# Patient Record
Sex: Female | Born: 1965 | Race: White | Hispanic: No | Marital: Married | Smoking: Never smoker
Health system: Southern US, Community
[De-identification: ages and names within clinical notes are randomized; demographics above are authoritative.]

---

## 2009-05-06 ENCOUNTER — Emergency Department (HOSPITAL_BASED_OUTPATIENT_CLINIC_OR_DEPARTMENT_OTHER): Admission: EM | Admit: 2009-05-06 | Discharge: 2009-05-06 | Payer: Self-pay | Admitting: Emergency Medicine

## 2010-07-06 LAB — URINALYSIS, ROUTINE W REFLEX MICROSCOPIC
Bilirubin Urine: NEGATIVE
Glucose, UA: NEGATIVE mg/dL
Hgb urine dipstick: NEGATIVE
Ketones, ur: NEGATIVE mg/dL
Nitrite: NEGATIVE
Protein, ur: NEGATIVE mg/dL
Specific Gravity, Urine: 1.019 (ref 1.005–1.030)
Urobilinogen, UA: 0.2 mg/dL (ref 0.0–1.0)
pH: 6.5 (ref 5.0–8.0)

## 2015-06-10 ENCOUNTER — Other Ambulatory Visit: Payer: Self-pay | Admitting: Physician Assistant

## 2015-06-10 DIAGNOSIS — Z1231 Encounter for screening mammogram for malignant neoplasm of breast: Secondary | ICD-10-CM

## 2015-08-15 ENCOUNTER — Encounter (HOSPITAL_BASED_OUTPATIENT_CLINIC_OR_DEPARTMENT_OTHER): Payer: Self-pay | Admitting: *Deleted

## 2015-08-15 ENCOUNTER — Emergency Department (HOSPITAL_BASED_OUTPATIENT_CLINIC_OR_DEPARTMENT_OTHER): Payer: 59

## 2015-08-15 ENCOUNTER — Emergency Department (HOSPITAL_BASED_OUTPATIENT_CLINIC_OR_DEPARTMENT_OTHER)
Admission: EM | Admit: 2015-08-15 | Discharge: 2015-08-15 | Disposition: A | Discharge status: Home / Self Care | Payer: 59 | Attending: Emergency Medicine | Admitting: Emergency Medicine

## 2015-08-15 DIAGNOSIS — Y939 Activity, unspecified: Secondary | ICD-10-CM | POA: Diagnosis not present

## 2015-08-15 DIAGNOSIS — S60212A Contusion of left wrist, initial encounter: Secondary | ICD-10-CM | POA: Diagnosis not present

## 2015-08-15 DIAGNOSIS — Y92009 Unspecified place in unspecified non-institutional (private) residence as the place of occurrence of the external cause: Secondary | ICD-10-CM | POA: Diagnosis not present

## 2015-08-15 DIAGNOSIS — Y999 Unspecified external cause status: Secondary | ICD-10-CM | POA: Insufficient documentation

## 2015-08-15 DIAGNOSIS — Z79899 Other long term (current) drug therapy: Secondary | ICD-10-CM | POA: Diagnosis not present

## 2015-08-15 DIAGNOSIS — W010XXA Fall on same level from slipping, tripping and stumbling without subsequent striking against object, initial encounter: Secondary | ICD-10-CM | POA: Diagnosis not present

## 2015-08-15 DIAGNOSIS — M25532 Pain in left wrist: Secondary | ICD-10-CM | POA: Diagnosis present

## 2015-08-15 NOTE — ED Provider Notes (Signed)
CSN: [id number]     Arrival date & time 08/15/15  1235 History   First MD Initiated Contact with Patient 08/15/15 1502     Chief Complaint  Patient presents with  . Wrist Pain     (Consider location/radiation/quality/duration/timing/severity/associated sxs/prior Treatment) HPI Patient presents with left wrist swelling and pain after fall at 1 PM today. States she was walking out of her house with suitcase in her left hand and tripped and fell onto suitcase. She had immediate pain to the left wrist with swelling. No limited mobility or weakness. She has no numbness or tingling. Denies any head or neck injury. History reviewed. No pertinent past medical history. History reviewed. No pertinent past surgical history. History reviewed. No pertinent family history. Social History  Substance Use Topics  . Smoking status: Never Smoker   . Smokeless tobacco: None  . Alcohol Use: No   OB History    No data available     Review of Systems  Constitutional: Negative for fever and chills.  HENT: Negative for facial swelling.   Respiratory: Negative for shortness of breath.   Cardiovascular: Negative for chest pain.  Gastrointestinal: Negative for nausea, vomiting, abdominal pain and diarrhea.  Musculoskeletal: Positive for arthralgias. Negative for myalgias, neck pain and neck stiffness.  Skin: Positive for wound. Negative for rash.  Neurological: Negative for dizziness, weakness, light-headedness, numbness and headaches.  All other systems reviewed and are negative.     Allergies  Aspirin  Home Medications   Prior to Admission medications   Medication Sig Start Date End Date Taking? Authorizing Provider  traZODone (DESYREL) 100 MG tablet Take 100 mg by mouth at bedtime.   Yes Historical Provider, MD   BP 172/89 mmHg  Pulse 90  Temp(Src) 98.6 F (37 C) (Oral)  Resp 18  Ht  (1.6 m)  Wt 160 lb (72.576 kg)  BMI 28.35 kg/m2  SpO2 100% Physical Exam  Constitutional: She is  oriented to person, place, and time. She appears well-developed and well-nourished. No distress.  HENT:  Head: Normocephalic and atraumatic.  Eyes: EOM are normal. Pupils are equal, round, and reactive to light.  Neck: Normal range of motion. Neck supple.  No posterior midline cervical tenderness to palpation.  Cardiovascular: Normal rate and regular rhythm.  Exam reveals no gallop and no friction rub.   No murmur heard. Pulmonary/Chest: Effort normal and breath sounds normal. No respiratory distress. She has no wheezes. She has no rales. She exhibits no tenderness.  Abdominal: Soft. Bowel sounds are normal. She exhibits no distension and no mass. There is no tenderness. There is no rebound and no guarding.  Musculoskeletal: Normal range of motion. She exhibits tenderness. She exhibits no edema.  No midline thoracic or lumbar tenderness. Pelvis is stable. Patient does have contusion and swelling to the medial surface of the left distal forearm. Chest tenderness over the distal radius. No carpal or hand tenderness. No snuffbox tenderness. Distal pulses are equal and intact. Good distal cap refill. Full range of motion of the left elbow and shoulder. No tenderness, swelling or deformity.  Neurological: She is alert and oriented to person, place, and time.  Normal bilateral grip strength. 5/5 motor in all extremities. Sensation is fully intact.  Skin: Skin is warm and dry. No rash noted. No erythema.  Psychiatric: She has a normal mood and affect. Her behavior is normal.  Nursing note and vitals reviewed.   ED Course  Procedures (including critical care time) Labs Review Labs Reviewed -  No data to display  Imaging Review Dg Wrist Complete Left  08/15/2015  CLINICAL DATA:  Medial left wrist pain secondary to a fall in the garage today. EXAM: LEFT WRIST - COMPLETE 3+ VIEW COMPARISON:  None. FINDINGS: There is no evidence of fracture or dislocation. There is soft tissue edema and swelling along  the lateral aspect of the distal radial shaft. IMPRESSION: No acute bone abnormality.  Soft tissue contusion. Electronically Signed   By: Francene BoyersJames  Maxwell M.D.   On: 08/15/2015 13:17   I have personally reviewed and evaluated these images and lab results as part of my medical decision-making.   EKG Interpretation None      MDM   Final diagnoses:  Wrist contusion, left, initial encounter    X-ray without evidence of fracture. Patient has obvious contusion and may also have sprained the wrist. No other injuries appreciated. Placed in splint. Advised if she continues to have pain that she follow-up with hand specialist. Return precautions given.    Loren Raceravid Layney Gillson, MD 08/15/15 [phone]

## 2015-08-15 NOTE — ED Notes (Signed)
MD at bedside. 

## 2015-08-15 NOTE — ED Notes (Signed)
Left wrist pain with obvious deformity since falling today.  CMS intact.

## 2015-08-15 NOTE — Discharge Instructions (Signed)
Contusion °A contusion is a deep bruise. Contusions are the result of a blunt injury to tissues and muscle fibers under the skin. The injury causes bleeding under the skin. The skin overlying the contusion may turn blue, purple, or yellow. Minor injuries will give you a painless contusion, but more severe contusions may stay painful and swollen for a few weeks.  °CAUSES  °This condition is usually caused by a blow, trauma, or direct force to an area of the body. °SYMPTOMS  °Symptoms of this condition include: °· Swelling of the injured area. °· Pain and tenderness in the injured area. °· Discoloration. The area may have redness and then turn blue, purple, or yellow. °DIAGNOSIS  °This condition is diagnosed based on a physical exam and medical history. An X-ray, CT scan, or MRI may be needed to determine if there are any associated injuries, such as broken bones (fractures). °TREATMENT  °Specific treatment for this condition depends on what area of the body was injured. In general, the best treatment for a contusion is resting, icing, applying pressure to (compression), and elevating the injured area. This is often called the RICE strategy. Over-the-counter anti-inflammatory medicines may also be recommended for pain control.  °HOME CARE INSTRUCTIONS  °· Rest the injured area. °· If directed, apply ice to the injured area: °· Put ice in a plastic bag. °· Place a towel between your skin and the bag. °· Leave the ice on for 20 minutes, 2-3 times per day. °· If directed, apply light compression to the injured area using an elastic bandage. Make sure the bandage is not wrapped too tightly. Remove and reapply the bandage as directed by your health care provider. °· If possible, raise (elevate) the injured area above the level of your heart while you are sitting or lying down. °· Take over-the-counter and prescription medicines only as told by your health care provider. °SEEK MEDICAL CARE IF: °· Your symptoms do not  improve after several days of treatment. °· Your symptoms get worse. °· You have difficulty moving the injured area. °SEEK IMMEDIATE MEDICAL CARE IF:  °· You have severe pain. °· You have numbness in a hand or foot. °· Your hand or foot turns pale or cold. °  °This information is not intended to replace advice given to you by your health care provider. Make sure you discuss any questions you have with your health care provider. °  °Document Released: 01/13/2005 Document Revised: 12/25/2014 Document Reviewed: 08/21/2014 °Elsevier Interactive Patient Education ©2016 [employer] ° °Wrist Sprain °A wrist sprain is a stretch or tear in the strong, fibrous tissues (ligaments) that connect your wrist bones. The ligaments of your wrist may be easily sprained. There are three types of wrist sprains. °· Grade 1. The ligament is not stretched or torn, but the sprain causes pain. °· Grade 2. The ligament is stretched or partially torn. You may be able to move your wrist, but not very much. °· Grade 3. The ligament or muscle completely tears. You may find it difficult or extremely painful to move your wrist even a little. °CAUSES °Often, wrist sprains are a result of a fall or an injury. The force of the impact causes the fibers of your ligament to stretch too much or tear. Common causes of wrist sprains include: °· Overextending your wrist while catching a ball with your hands. °· Repetitive or strenuous extension or bending of your wrist. °· Landing on your hand during a fall. °RISK FACTORS °· Having   wrist injuries.  Playing contact sports, such as boxing or wrestling.  Participating in activities in which falling is common.  Having poor wrist strength and flexibility. SIGNS AND SYMPTOMS  Wrist pain.  Wrist tenderness.  Inflammation or bruising of the wrist area.  Hearing a "pop" or feeling a tear at the time of the injury.  Decreased wrist movement due to pain, stiffness, or  weakness. DIAGNOSIS Your health care provider will examine your wrist. In some cases, an X-ray will be taken to make sure you did not break any bones. If your health care provider thinks that you tore a ligament, he or she may order an MRI of your wrist. TREATMENT Treatment involves resting and icing your wrist. You may also need to take pain medicines to help lessen pain and inflammation. Your health care provider may recommend keeping your wrist still (immobilized) with a splint to help your sprain heal. When the splint is no longer necessary, you may need to perform strengthening and stretching exercises. These exercises help you to regain strength and full range of motion in your wrist. Surgery is not usually needed for wrist sprains unless the ligament completely tears. HOME CARE INSTRUCTIONS  Rest your wrist. Do not do things that cause pain.  Wear your wrist splint as directed by your health care provider.  Take medicines only as directed by your health care provider.  To ease pain and swelling, apply ice to the injured area.  Put ice in a plastic bag.  Place a towel between your skin and the bag.  Leave the ice on for 20 minutes, 2-3 times a day. SEEK MEDICAL CARE IF:  Your pain, discomfort, or swelling gets worse even with treatment.  You feel sudden numbness in your hand.   This information is not intended to replace advice given to you by your health care provider. Make sure you discuss any questions you have with your health care provider.   Document Released: 12/07/2013 Document Reviewed: 12/07/2013 Elsevier Interactive Patient Education [employer]2016 [employer]

## 2015-09-04 ENCOUNTER — Ambulatory Visit
Admission: RE | Admit: 2015-09-04 | Discharge: 2015-09-04 | Disposition: A | Discharge status: Home / Self Care | Payer: 59 | Source: Ambulatory Visit | Attending: Physician Assistant | Admitting: Physician Assistant

## 2015-09-04 ENCOUNTER — Other Ambulatory Visit: Payer: Self-pay | Admitting: Physician Assistant

## 2015-09-04 DIAGNOSIS — Z1231 Encounter for screening mammogram for malignant neoplasm of breast: Secondary | ICD-10-CM

## 2016-09-02 IMAGING — DX DG WRIST COMPLETE 3+V*L*
4 series · 4 of 4 positions shown · non-contrast
Comparison: None.

CLINICAL DATA: Medial left wrist pain secondary to a fall in the
garage today.

EXAM:
LEFT WRIST - COMPLETE 3+ VIEW

[wrist pa]
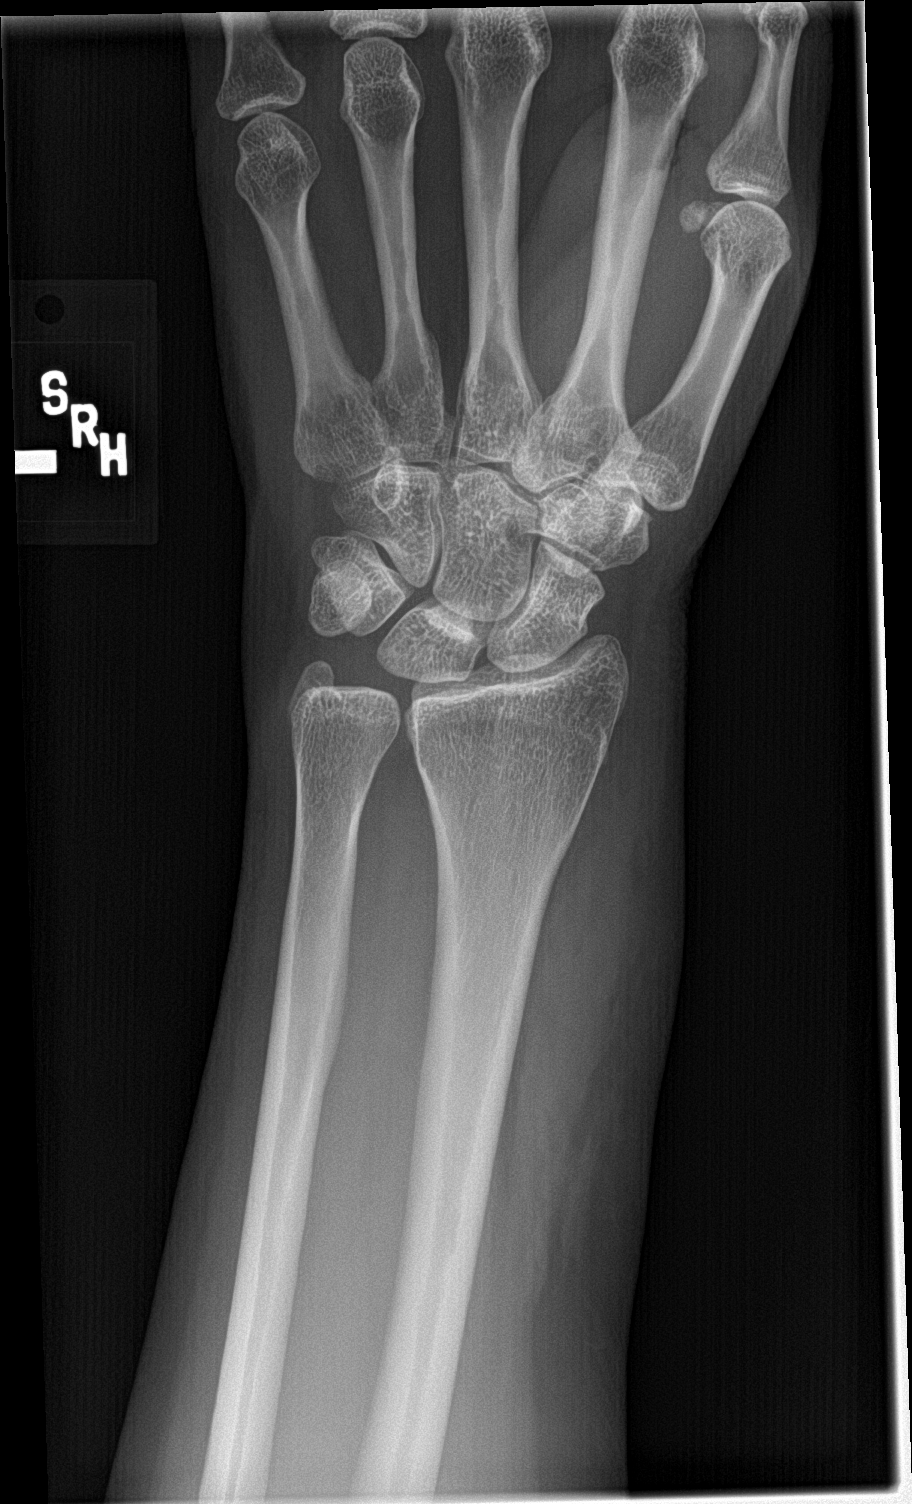

[wrist obl]
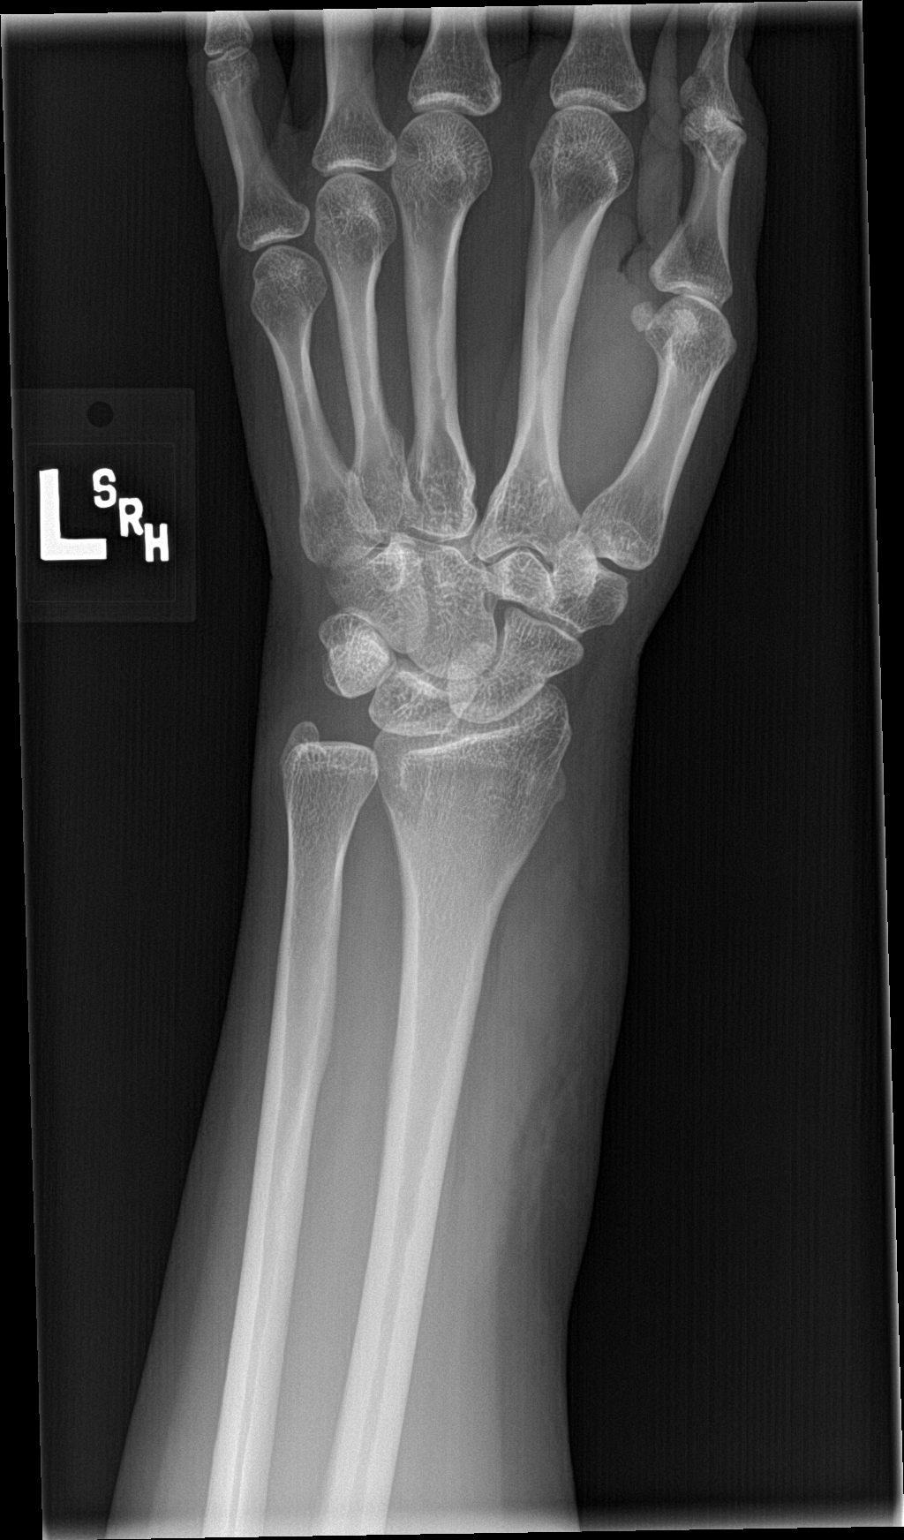

[wrist lat]
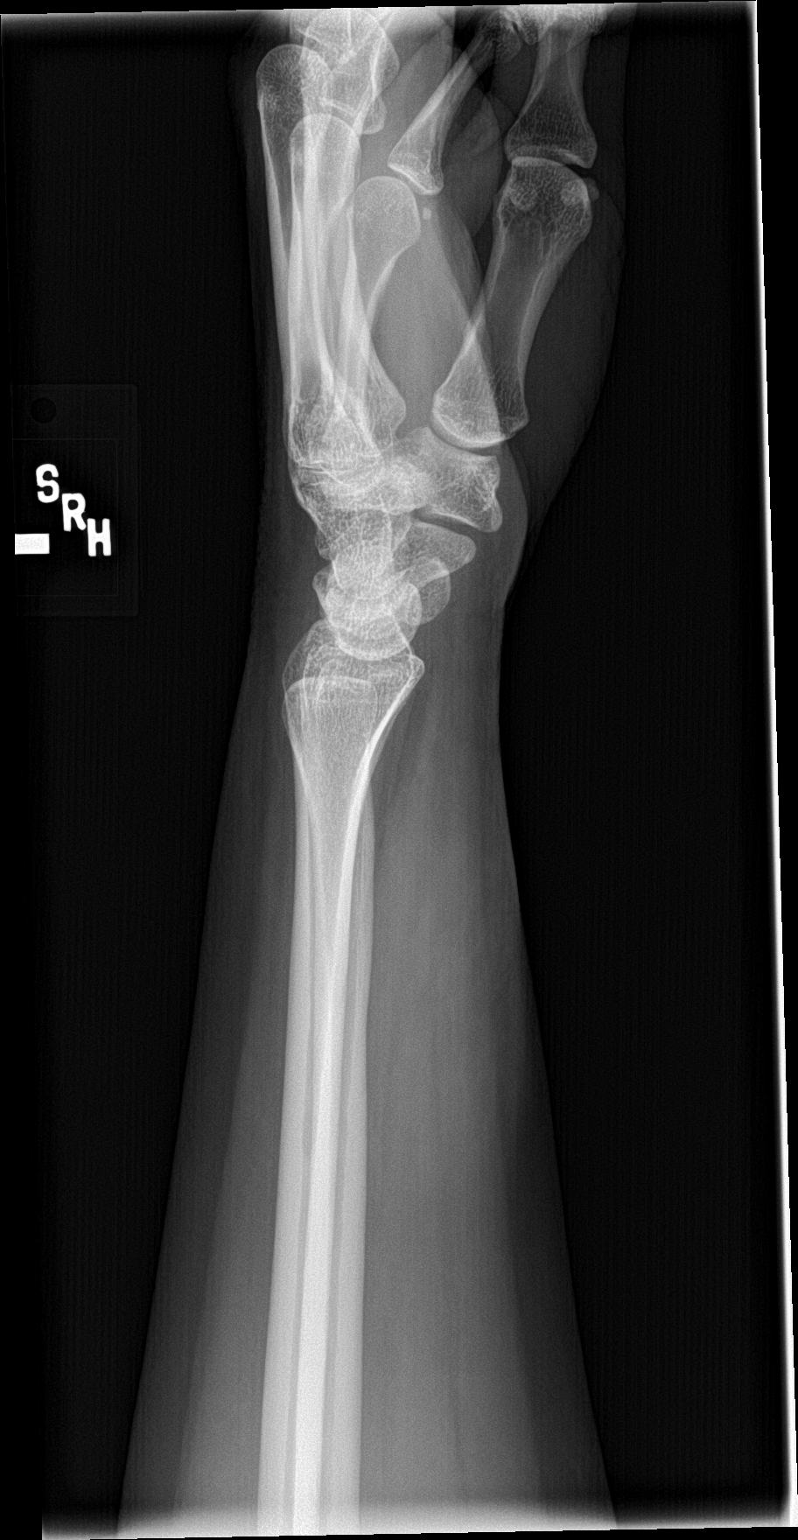

[wrist navicular]
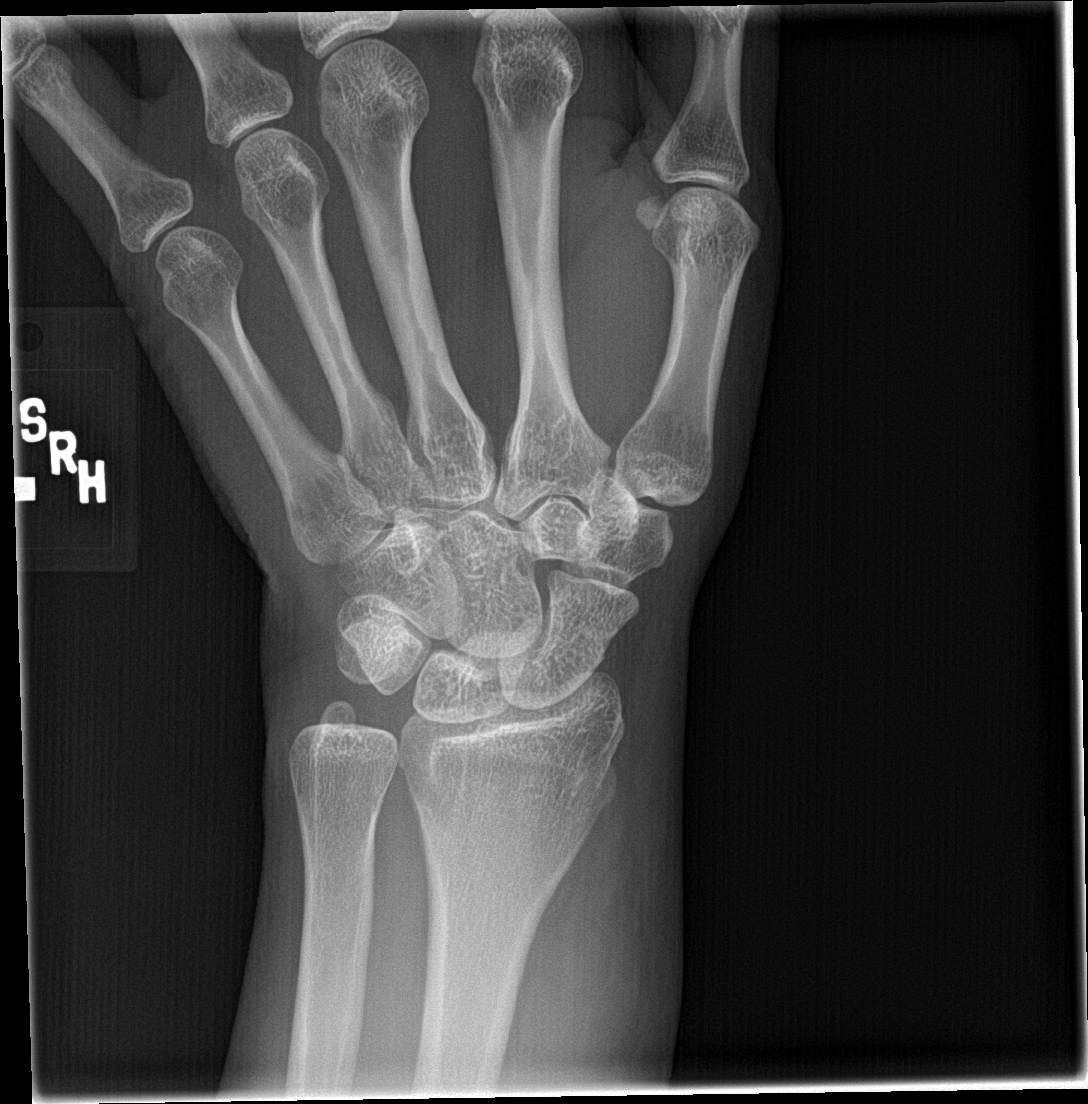

[4 of 4 positions shown; findings below may reference images not displayed]

FINDINGS: There is no evidence of fracture or dislocation. There is soft
tissue edema and swelling along the lateral aspect of the distal
radial shaft.
IMPRESSION: No acute bone abnormality.  Soft tissue contusion.

## 2020-07-22 ENCOUNTER — Other Ambulatory Visit: Payer: Self-pay | Admitting: Physician Assistant

## 2020-07-22 DIAGNOSIS — Z1231 Encounter for screening mammogram for malignant neoplasm of breast: Secondary | ICD-10-CM

## 2022-02-07 LAB — COLOGUARD: COLOGUARD: NEGATIVE
